# Patient Record
Sex: Male | Born: 1985 | Race: Asian | Hispanic: No | Marital: Married | Smoking: Current every day smoker
Health system: Southern US, Community
[De-identification: ages and names within clinical notes are randomized; demographics above are authoritative.]

---

## 2020-09-30 ENCOUNTER — Other Ambulatory Visit: Payer: Self-pay

## 2020-09-30 ENCOUNTER — Emergency Department (HOSPITAL_BASED_OUTPATIENT_CLINIC_OR_DEPARTMENT_OTHER): Payer: BC Managed Care – PPO

## 2020-09-30 ENCOUNTER — Emergency Department (HOSPITAL_BASED_OUTPATIENT_CLINIC_OR_DEPARTMENT_OTHER)
Admission: EM | Admit: 2020-09-30 | Discharge: 2020-09-30 | Disposition: A | Discharge status: Home / Self Care | Payer: BC Managed Care – PPO | Attending: Emergency Medicine | Admitting: Emergency Medicine

## 2020-09-30 ENCOUNTER — Encounter (HOSPITAL_BASED_OUTPATIENT_CLINIC_OR_DEPARTMENT_OTHER): Payer: Self-pay | Admitting: Emergency Medicine

## 2020-09-30 DIAGNOSIS — S9032XA Contusion of left foot, initial encounter: Secondary | ICD-10-CM

## 2020-09-30 DIAGNOSIS — F17200 Nicotine dependence, unspecified, uncomplicated: Secondary | ICD-10-CM | POA: Insufficient documentation

## 2020-09-30 DIAGNOSIS — W228XXA Striking against or struck by other objects, initial encounter: Secondary | ICD-10-CM | POA: Diagnosis not present

## 2020-09-30 DIAGNOSIS — S99922A Unspecified injury of left foot, initial encounter: Secondary | ICD-10-CM | POA: Diagnosis present

## 2020-09-30 NOTE — ED Provider Notes (Signed)
[facility] EMERGENCY DEPARTMENT Provider Note   CSN: [id number] Arrival date & time: 09/30/20  1138     History Chief Complaint  Patient presents with  . Foot Injury    Todd Colon is a 34 y.o. male.  Patient presents the emergency department for acute onset of left foot pain starting yesterday.  Patient states that he kicked a cabinet with his foot.  He is able to bear weight.  He has applied ice and use Tylenol.  No numbness or tingling.  He has swelling to the top of his foot and pain radiates to the middle toes.        History reviewed. No pertinent past medical history.  There are no problems to display for this patient.   History reviewed. No pertinent surgical history.     No family history on file.  Social History   Tobacco Use  . Smoking status: Current Every Day Smoker  . Smokeless tobacco: Never Used  Substance Use Topics  . Alcohol use: Not Currently  . Drug use: Not on file    Home Medications Prior to Admission medications   Not on File    Allergies    Patient has no known allergies.  Review of Systems   Review of Systems  Constitutional: Negative for activity change.  Musculoskeletal: Positive for arthralgias and joint swelling. Negative for back pain, gait problem and neck pain.  Skin: Negative for wound.  Neurological: Negative for weakness and numbness.    Physical Exam Updated Vital Signs BP 121/89 (BP Location: Left Arm)   Pulse 78   Temp 99 F (37.2 C) (Oral)   Resp 16   Ht 5\' 6"  (1.676 m)   Wt 83.9 kg   SpO2 97%   BMI 29.86 kg/m   Physical Exam Vitals and nursing note reviewed.  Constitutional:      Appearance: He is well-developed.  HENT:     Head: Normocephalic and atraumatic.  Eyes:     Conjunctiva/sclera: Conjunctivae normal.  Cardiovascular:     Pulses: Normal pulses. No decreased pulses.  Musculoskeletal:        General: Tenderness present.     Cervical back: Normal range of motion and neck  supple.     Left ankle: No tenderness. Normal range of motion.     Left foot: Normal range of motion and normal capillary refill. Swelling and tenderness present. Normal pulse.     Comments: Mild swelling over the left midfoot with tenderness extending from the midfoot to the second and third toes.  Skin:    General: Skin is warm and dry.  Neurological:     Mental Status: He is alert.     Sensory: No sensory deficit.     Comments: Motor, sensation, and vascular distal to the injury is fully intact.      ED Results / Procedures / Treatments   Labs (all labs ordered are listed, but only abnormal results are displayed) Labs Reviewed - No data to display  EKG None  Radiology DG Foot Complete Left  Result Date: 09/30/2020 CLINICAL DATA:  Pain. EXAM: LEFT FOOT - COMPLETE 3+ VIEW COMPARISON:  None. FINDINGS: There is no evidence of fracture or dislocation. There is no evidence of arthropathy or other focal bone abnormality. Soft tissues are unremarkable. IMPRESSION: Negative. Electronically Signed   By: 10/02/2020 M.D.   On: 09/30/2020 12:56    Procedures Procedures (including critical care time)  Medications Ordered in ED Medications - No  data to display  ED Course  I have reviewed the triage vital signs and the nursing notes.  Pertinent labs & imaging results that were available during my care of the patient were reviewed by me and considered in my medical decision making (see chart for details).  Patient seen and examined.  Discussed x-ray results with patient.  Vital signs reviewed and are as follows: BP 121/89 (BP Location: Left Arm)   Pulse 78   Temp 99 F (37.2 C) (Oral)   Resp 16   Ht 5\' 6"  (1.676 m)   Wt 83.9 kg   SpO2 97%   BMI 29.86 kg/m   We will provide with orthopedic shoe.  Discussed rice protocol, NSAIDs.    MDM Rules/Calculators/A&P                          Patient with contusion of left foot, negative x-rays.  Lower extremity  neurovascularly intact.  Conservative measures indicated.   Final Clinical Impression(s) / ED Diagnoses Final diagnoses:  Contusion of left foot, initial encounter    Rx / DC Orders ED Discharge Orders    None       , PA-C 09/30/20 1407    10/02/20, MD 10/01/20 [phone]

## 2020-09-30 NOTE — Discharge Instructions (Signed)
Please read and follow all provided instructions.  Your diagnoses today include:  1. Contusion of left foot, initial encounter     Tests performed today include:  An x-ray of the affected area - does NOT show any broken bones  Vital signs. See below for your results today.   Medications prescribed:  Please use over-the-counter NSAID medications (ibuprofen, naproxen) as directed on the packaging for pain.   Take any prescribed medications only as directed.  Home care instructions:   Follow any educational materials contained in this packet  Follow R.I.C.E. Protocol:  R - rest your injury   I  - use ice on injury without applying directly to skin  C - compress injury with bandage or splint  E - elevate the injury as much as possible  Follow-up instructions: Please follow-up with your primary care provider if you continue to have significant pain in 1 week. In this case you may have a more severe injury that requires further care.   Return instructions:   Please return if your toes or feet are numb or tingling, appear gray or blue, or you have severe pain (also elevate the leg and loosen splint or wrap if you were given one)  Please return to the Emergency Department if you experience worsening symptoms.   Please return if you have any other emergent concerns.  Additional Information:  Your vital signs today were: BP 121/89 (BP Location: Left Arm)   Pulse 78   Temp 99 F (37.2 C) (Oral)   Resp 16   Ht 5\' 6"  (1.676 m)   Wt 83.9 kg   SpO2 97%   BMI 29.86 kg/m  If your blood pressure (BP) was elevated above 135/85 this visit, please have this repeated by your doctor within one month. --------------

## 2020-09-30 NOTE — ED Triage Notes (Signed)
He got mad and kicked a cabinet yesterday injuring L foot.

## 2021-03-28 ENCOUNTER — Other Ambulatory Visit: Payer: Self-pay | Admitting: Family Medicine

## 2021-03-28 DIAGNOSIS — R221 Localized swelling, mass and lump, neck: Secondary | ICD-10-CM

## 2021-04-03 ENCOUNTER — Ambulatory Visit
Admission: RE | Admit: 2021-04-03 | Discharge: 2021-04-03 | Disposition: A | Discharge status: Home / Self Care | Payer: BC Managed Care – PPO | Source: Ambulatory Visit | Attending: Family Medicine | Admitting: Family Medicine

## 2021-04-03 ENCOUNTER — Other Ambulatory Visit (HOSPITAL_COMMUNITY)
Admission: RE | Admit: 2021-04-03 | Discharge: 2021-04-03 | Disposition: A | Discharge status: Home / Self Care | Payer: BC Managed Care – PPO | Source: Ambulatory Visit | Attending: Internal Medicine | Admitting: Internal Medicine

## 2021-04-03 DIAGNOSIS — R221 Localized swelling, mass and lump, neck: Secondary | ICD-10-CM

## 2021-04-05 LAB — CYTOLOGY - NON PAP

## 2021-04-17 ENCOUNTER — Encounter (HOSPITAL_COMMUNITY): Payer: Self-pay

## 2021-10-05 IMAGING — US US FNA BIOPSY THYROID 1ST LESION
1 series · 13 of 18 positions shown · non-contrast
Comparison: US 03/25/21

MEDICATIONS:
5 cc 1% lidocaine

COMPLICATIONS:
None immediate.

INDICATION: Indeterminate thyroid nodule

Right mid lobe thyroid nodule
5.1 cm
EXAM:
ULTRASOUND GUIDED FINE NEEDLE ASPIRATION OF INDETERMINATE THYROID
NODULE
TECHNIQUE: Informed written consent was obtained from the patient after a
discussion of the risks, benefits and alternatives to treatment.
Questions regarding the procedure were encouraged and answered. A
timeout was performed prior to the initiation of the procedure.

[Series 1: us fna biopsy thyroid 1st lesion · 0.07mm/px · 18 acquisitions, 13 frames shown]
[im 1/18]
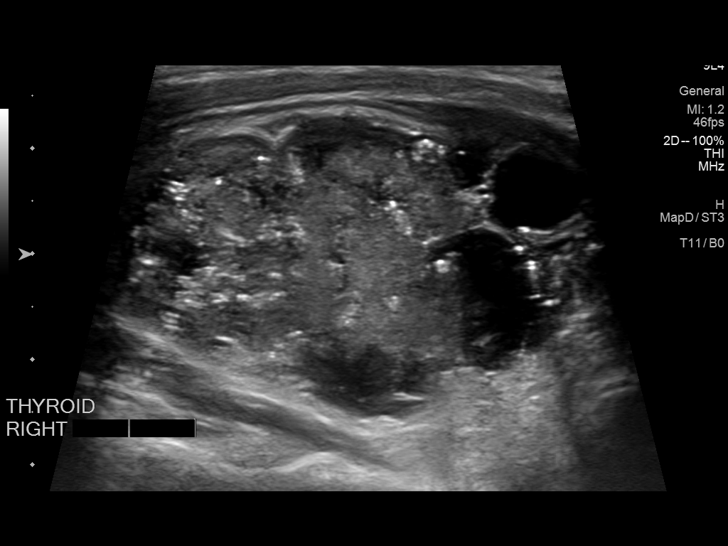
[im 3/18]
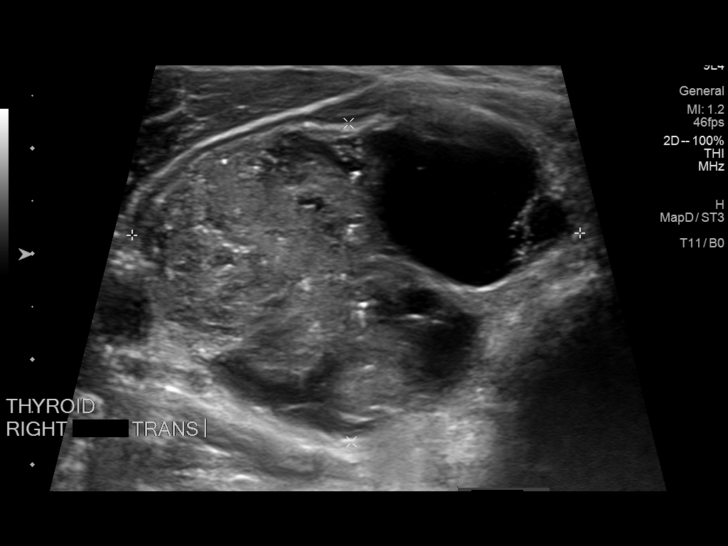
[im 4/18]
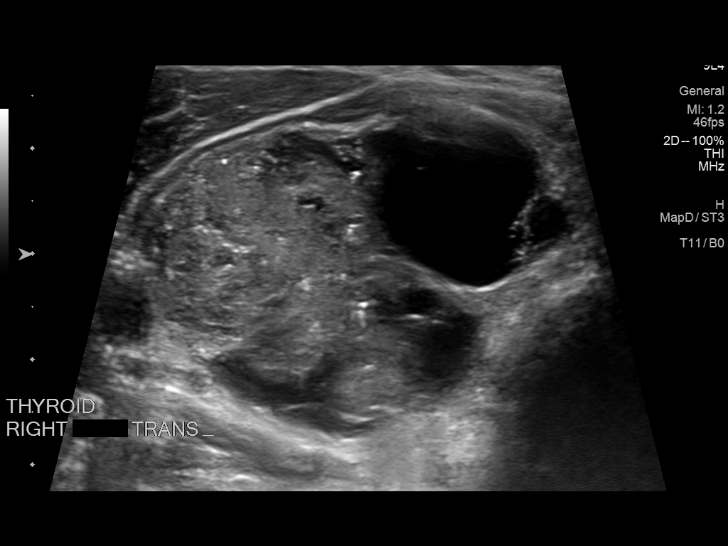
[im 5/18]
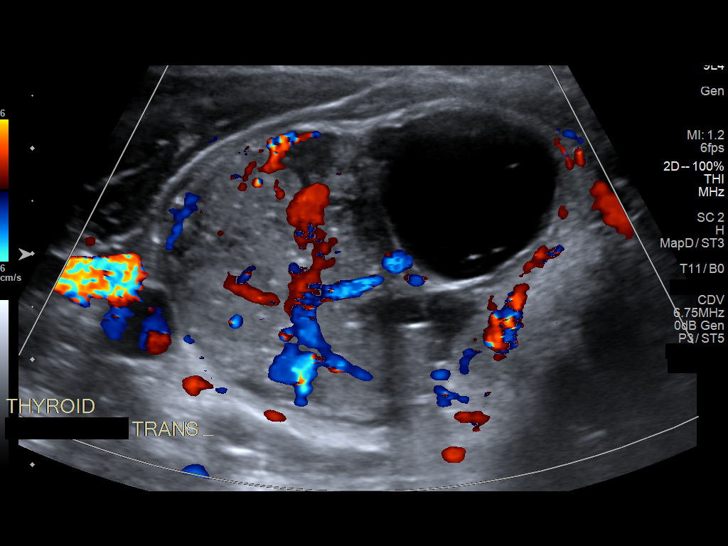
[im 7/18]
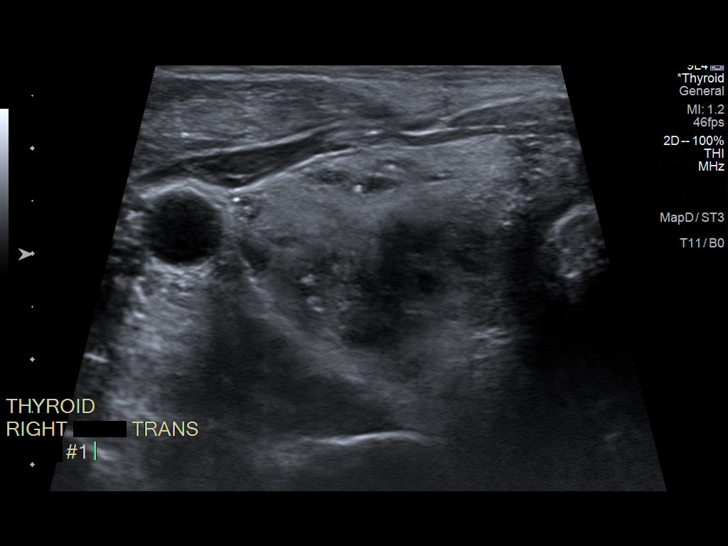
[im 8/18]
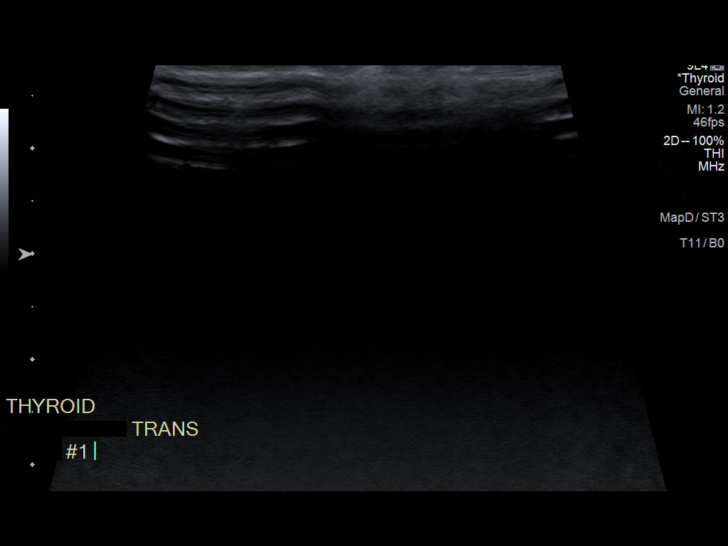
[im 10/18]
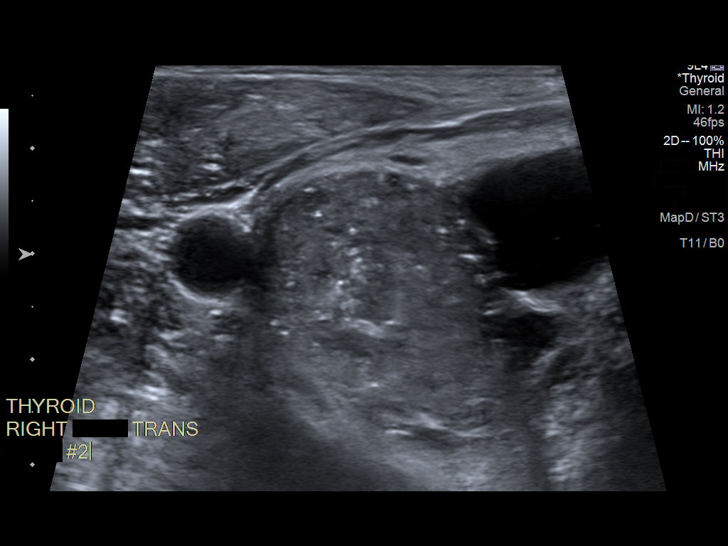
[im 11/18]
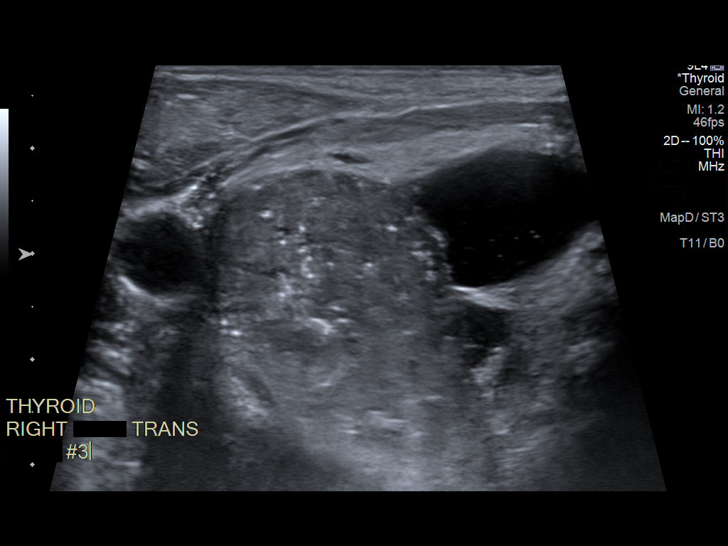
[im 12/18]
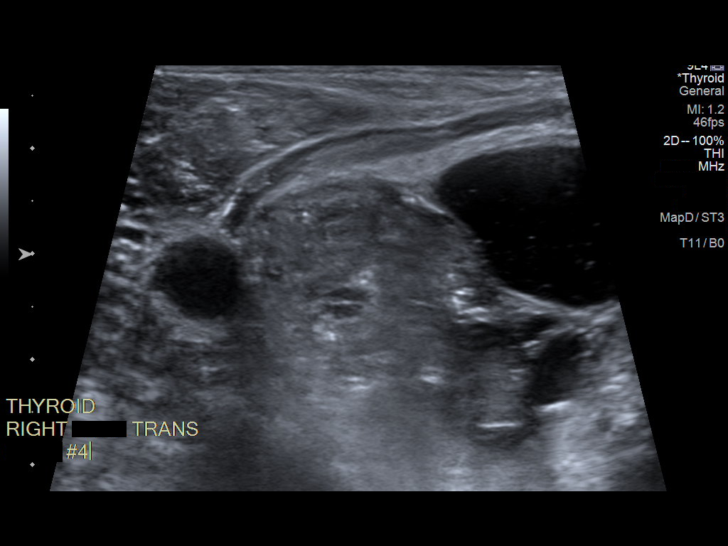
[im 14/18]
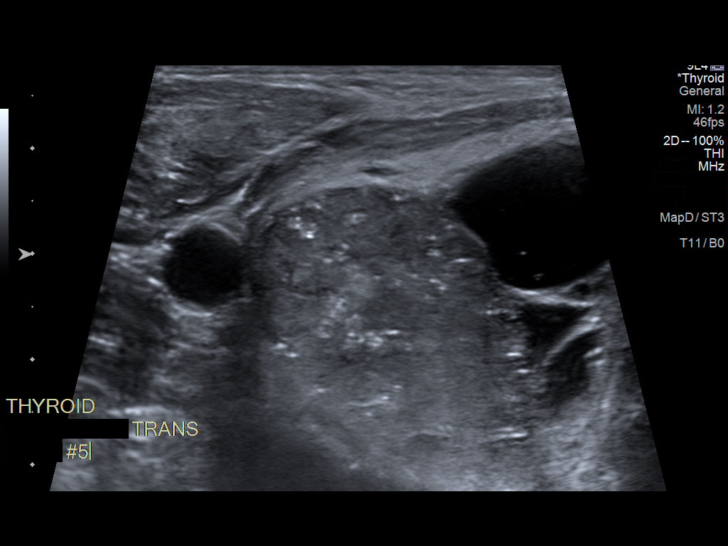
[im 15/18]
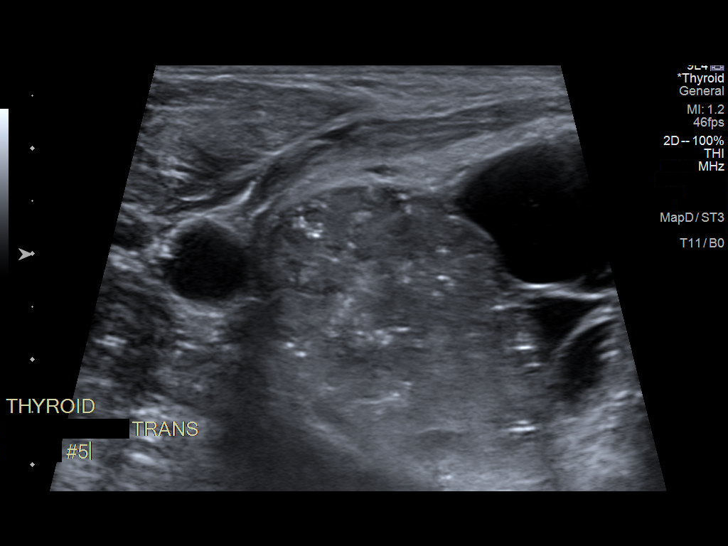
[im 16/18]
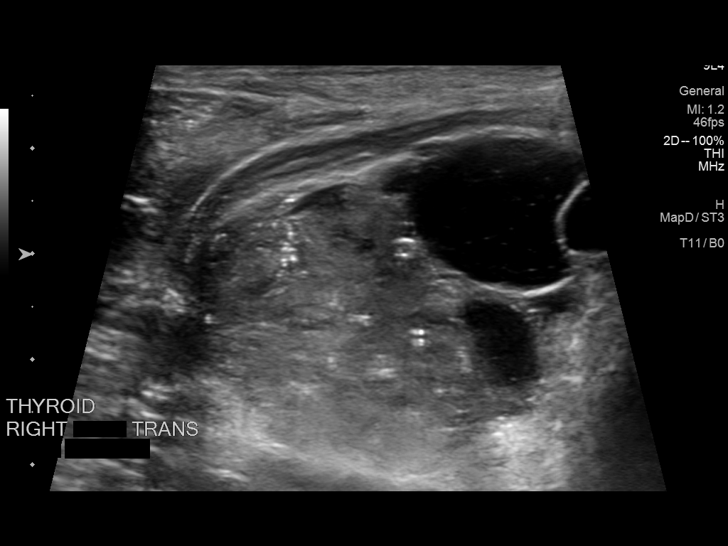
[im 18/18]
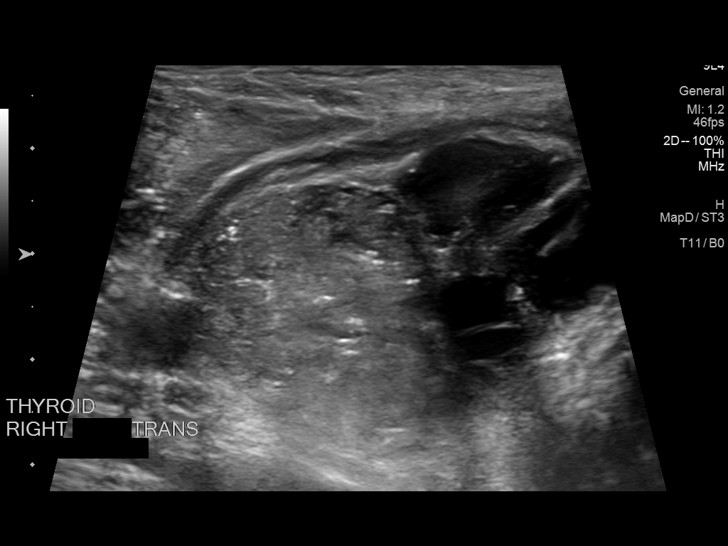

[13 of 18 positions shown; findings below may reference images not displayed]

Pre-procedural ultrasound scanning demonstrated unchanged size and
appearance of the indeterminate nodule within the right thyroid

The procedure was planned. The neck was prepped in the usual sterile
fashion, and a sterile drape was applied covering the operative
field. A timeout was performed prior to the initiation of the
procedure. Local anesthesia was provided with 1% lidocaine.

Under direct ultrasound guidance, 5 FNA biopsies were performed of
the right mid lobe thyroid nodule with a 25 gauge needle.

2 of these samples were obtained for AFIRMA

Multiple ultrasound images were saved for procedural documentation
purposes. The samples were prepared and submitted to pathology.

Limited post procedural scanning was negative for hematoma or
additional complication. Dressings were placed. The patient
tolerated the above procedures procedure well without immediate
postprocedural complication.
FINDINGS: Nodule reference number based on prior diagnostic ultrasound: 1

Maximum size: 5.1 cm

Location: Right; Mid

ACR TI-RADS risk category: TR5 (>/= 7 points)

Reason for biopsy: meets ACR TI-RADS criteria

Ultrasound imaging confirms appropriate placement of the needles
within the thyroid nodule.
IMPRESSION: Technically successful ultrasound guided fine needle aspiration of
right mid lobe thyroid nodule

Read by

Karl Magnus Oskal

## 2022-02-05 DIAGNOSIS — C73 Malignant neoplasm of thyroid gland: Secondary | ICD-10-CM | POA: Diagnosis not present

## 2022-02-05 DIAGNOSIS — E890 Postprocedural hypothyroidism: Secondary | ICD-10-CM | POA: Diagnosis not present

## 2022-02-05 DIAGNOSIS — E559 Vitamin D deficiency, unspecified: Secondary | ICD-10-CM | POA: Diagnosis not present

## 2022-02-11 DIAGNOSIS — E890 Postprocedural hypothyroidism: Secondary | ICD-10-CM | POA: Diagnosis not present

## 2022-02-11 DIAGNOSIS — F17210 Nicotine dependence, cigarettes, uncomplicated: Secondary | ICD-10-CM | POA: Diagnosis not present

## 2022-02-11 DIAGNOSIS — E782 Mixed hyperlipidemia: Secondary | ICD-10-CM | POA: Diagnosis not present

## 2022-02-11 DIAGNOSIS — C73 Malignant neoplasm of thyroid gland: Secondary | ICD-10-CM | POA: Diagnosis not present

## 2022-05-19 DIAGNOSIS — R590 Localized enlarged lymph nodes: Secondary | ICD-10-CM | POA: Diagnosis not present

## 2022-05-19 DIAGNOSIS — E890 Postprocedural hypothyroidism: Secondary | ICD-10-CM | POA: Diagnosis not present

## 2022-05-19 DIAGNOSIS — C73 Malignant neoplasm of thyroid gland: Secondary | ICD-10-CM | POA: Diagnosis not present

## 2022-05-26 DIAGNOSIS — F17200 Nicotine dependence, unspecified, uncomplicated: Secondary | ICD-10-CM | POA: Diagnosis not present

## 2022-05-26 DIAGNOSIS — C73 Malignant neoplasm of thyroid gland: Secondary | ICD-10-CM | POA: Diagnosis not present

## 2022-08-20 DIAGNOSIS — E782 Mixed hyperlipidemia: Secondary | ICD-10-CM | POA: Diagnosis not present

## 2022-08-20 DIAGNOSIS — E890 Postprocedural hypothyroidism: Secondary | ICD-10-CM | POA: Diagnosis not present

## 2022-08-20 DIAGNOSIS — C73 Malignant neoplasm of thyroid gland: Secondary | ICD-10-CM | POA: Diagnosis not present

## 2022-11-26 DIAGNOSIS — C73 Malignant neoplasm of thyroid gland: Secondary | ICD-10-CM | POA: Diagnosis not present

## 2022-12-22 DIAGNOSIS — E782 Mixed hyperlipidemia: Secondary | ICD-10-CM | POA: Diagnosis not present

## 2022-12-22 DIAGNOSIS — C73 Malignant neoplasm of thyroid gland: Secondary | ICD-10-CM | POA: Diagnosis not present

## 2022-12-22 DIAGNOSIS — E890 Postprocedural hypothyroidism: Secondary | ICD-10-CM | POA: Diagnosis not present

## 2022-12-22 DIAGNOSIS — F17210 Nicotine dependence, cigarettes, uncomplicated: Secondary | ICD-10-CM | POA: Diagnosis not present

## 2023-01-03 DIAGNOSIS — U071 COVID-19: Secondary | ICD-10-CM | POA: Diagnosis not present

## 2023-01-03 DIAGNOSIS — R52 Pain, unspecified: Secondary | ICD-10-CM | POA: Diagnosis not present

## 2023-04-23 DIAGNOSIS — C73 Malignant neoplasm of thyroid gland: Secondary | ICD-10-CM | POA: Diagnosis not present

## 2023-04-23 DIAGNOSIS — E890 Postprocedural hypothyroidism: Secondary | ICD-10-CM | POA: Diagnosis not present

## 2023-05-07 DIAGNOSIS — Z85850 Personal history of malignant neoplasm of thyroid: Secondary | ICD-10-CM | POA: Diagnosis not present

## 2023-05-07 DIAGNOSIS — C73 Malignant neoplasm of thyroid gland: Secondary | ICD-10-CM | POA: Diagnosis not present

## 2023-05-11 DIAGNOSIS — C73 Malignant neoplasm of thyroid gland: Secondary | ICD-10-CM | POA: Diagnosis not present

## 2023-08-25 DIAGNOSIS — E890 Postprocedural hypothyroidism: Secondary | ICD-10-CM | POA: Diagnosis not present

## 2023-08-25 DIAGNOSIS — E782 Mixed hyperlipidemia: Secondary | ICD-10-CM | POA: Diagnosis not present

## 2023-08-25 DIAGNOSIS — C73 Malignant neoplasm of thyroid gland: Secondary | ICD-10-CM | POA: Diagnosis not present

## 2024-01-28 DIAGNOSIS — F17200 Nicotine dependence, unspecified, uncomplicated: Secondary | ICD-10-CM | POA: Diagnosis not present

## 2024-01-28 DIAGNOSIS — E890 Postprocedural hypothyroidism: Secondary | ICD-10-CM | POA: Diagnosis not present

## 2024-01-28 DIAGNOSIS — E782 Mixed hyperlipidemia: Secondary | ICD-10-CM | POA: Diagnosis not present

## 2024-01-28 DIAGNOSIS — C73 Malignant neoplasm of thyroid gland: Secondary | ICD-10-CM | POA: Diagnosis not present
